# Patient Record
Sex: Female | Born: 1965 | Race: White | Hispanic: No | Smoking: Never smoker
Health system: Southern US, Community
[De-identification: ages and names within clinical notes are randomized; demographics above are authoritative.]

## PROBLEM LIST (undated history)

## (undated) DIAGNOSIS — E119 Type 2 diabetes mellitus without complications: Secondary | ICD-10-CM

## (undated) DIAGNOSIS — E7800 Pure hypercholesterolemia, unspecified: Secondary | ICD-10-CM

## (undated) DIAGNOSIS — Z8774 Personal history of (corrected) congenital malformations of heart and circulatory system: Secondary | ICD-10-CM

## (undated) DIAGNOSIS — I10 Essential (primary) hypertension: Secondary | ICD-10-CM

## (undated) DIAGNOSIS — I519 Heart disease, unspecified: Secondary | ICD-10-CM

## (undated) HISTORY — DX: Essential (primary) hypertension: I10

## (undated) HISTORY — DX: Personal history of (corrected) congenital malformations of heart and circulatory system: Z87.74

## (undated) HISTORY — DX: Pure hypercholesterolemia, unspecified: E78.00

## (undated) HISTORY — DX: Type 2 diabetes mellitus without complications: E11.9

## (undated) HISTORY — DX: Heart disease, unspecified: I51.9

---

## 2006-02-19 ENCOUNTER — Encounter: Admission: RE | Admit: 2006-02-19 | Discharge: 2006-02-19 | Payer: Self-pay | Admitting: Family Medicine

## 2006-03-05 ENCOUNTER — Encounter: Admission: RE | Admit: 2006-03-05 | Discharge: 2006-03-05 | Payer: Self-pay | Admitting: Family Medicine

## 2007-03-29 ENCOUNTER — Encounter: Admission: RE | Admit: 2007-03-29 | Discharge: 2007-03-29 | Payer: Self-pay | Admitting: Family Medicine

## 2008-03-29 ENCOUNTER — Encounter: Admission: RE | Admit: 2008-03-29 | Discharge: 2008-03-29 | Payer: Self-pay | Admitting: Family Medicine

## 2008-05-06 IMAGING — MG MM DIGITAL SCREENING BILAT W/ CAD
4 series · 4 of 4 positions shown · non-contrast
Comparison: none

DG SCREEN MAMMOGRAM BILATERAL
Bilateral CC and MLO view(s) were taken.

DIGITAL SCREENING MAMMOGRAM WITH CAD:
There are scattered fibroglandular densities.  No masses or malignant type calcifications are 
identified.  Compared with prior studies.

[R CC]
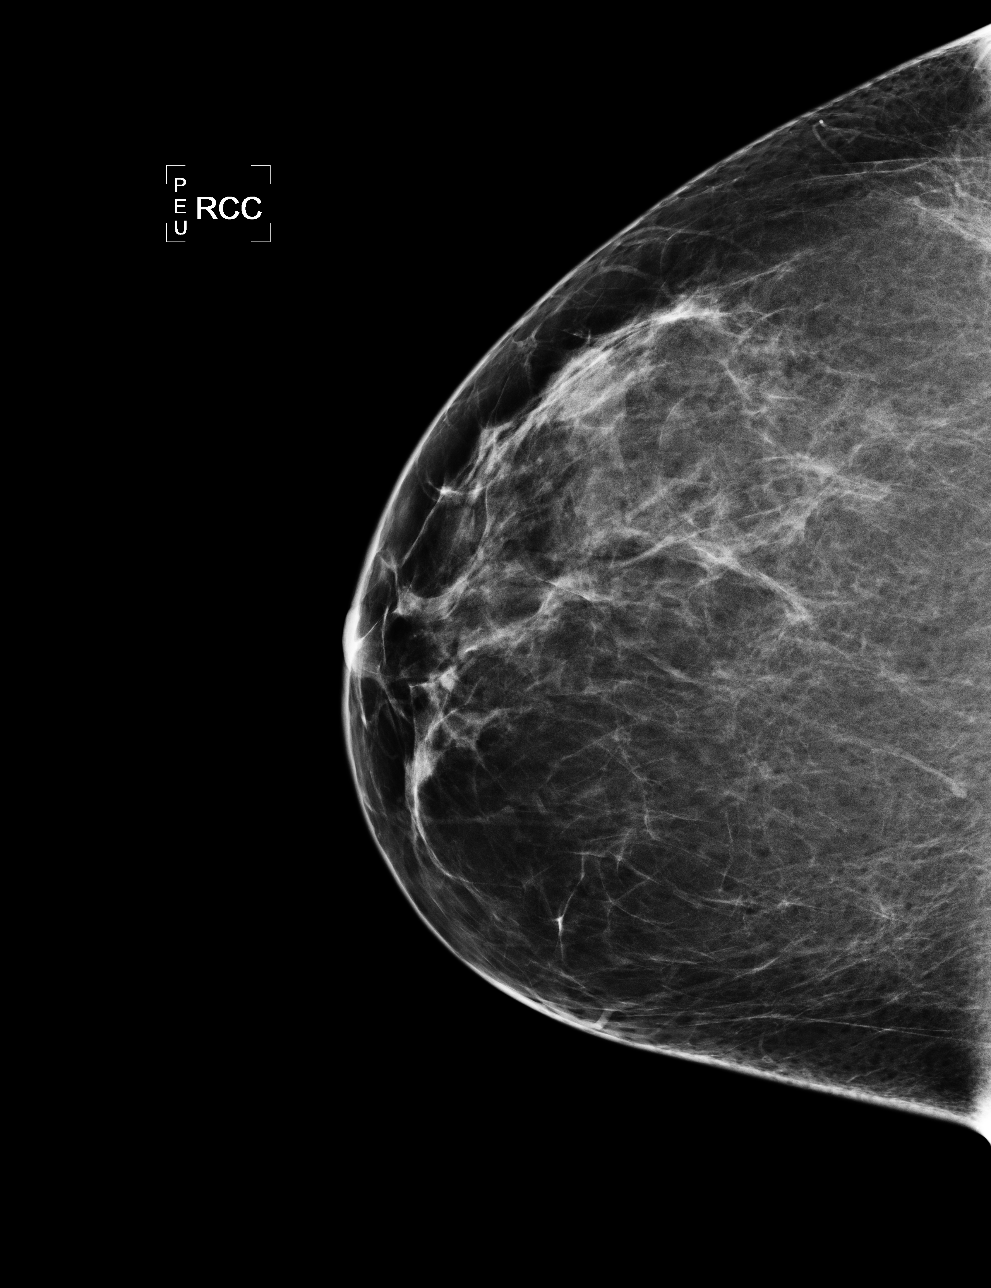

[L CC]
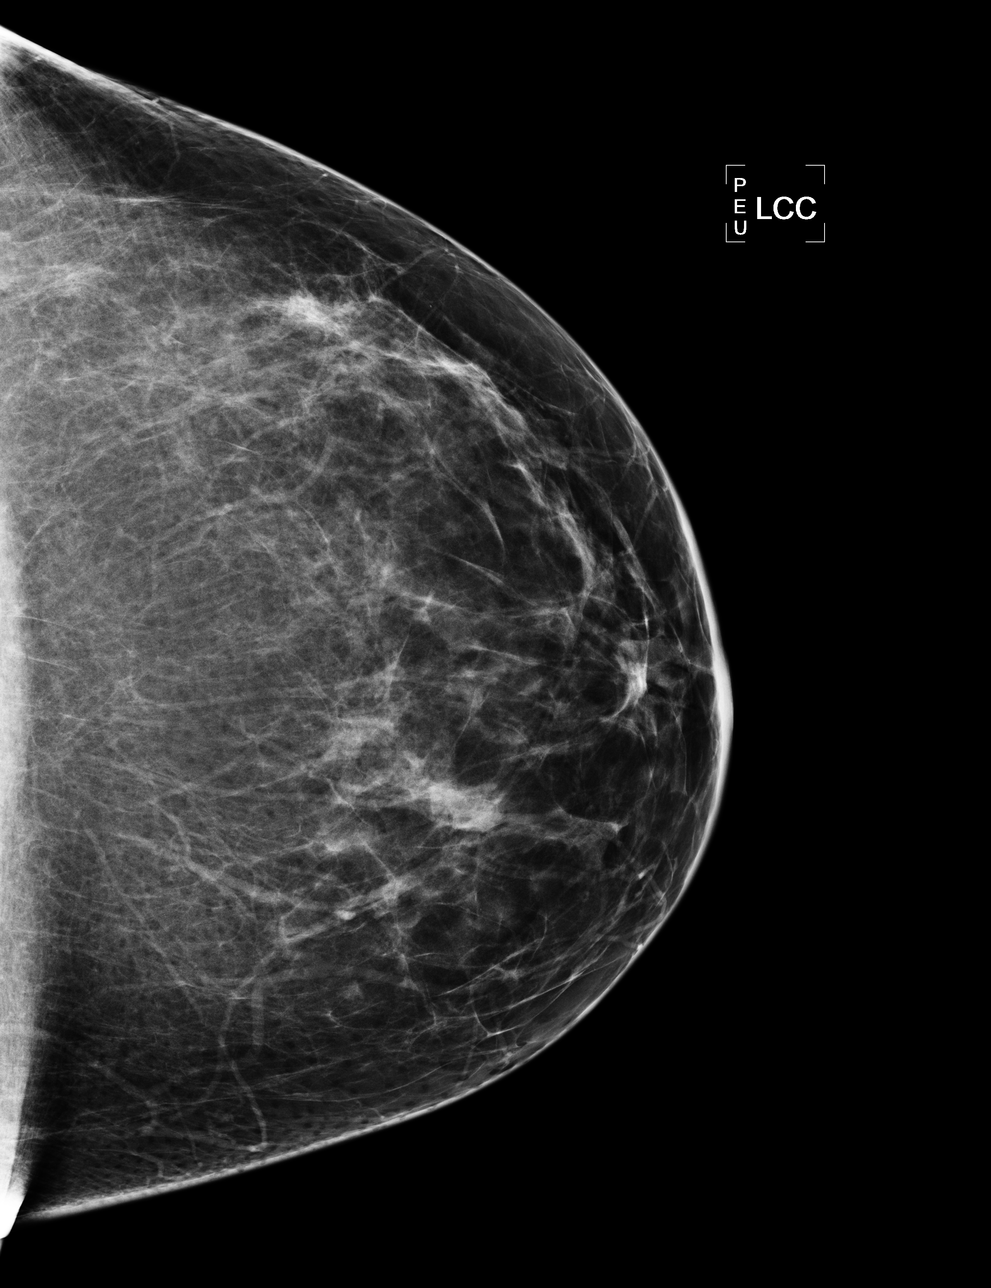

[L MLO]
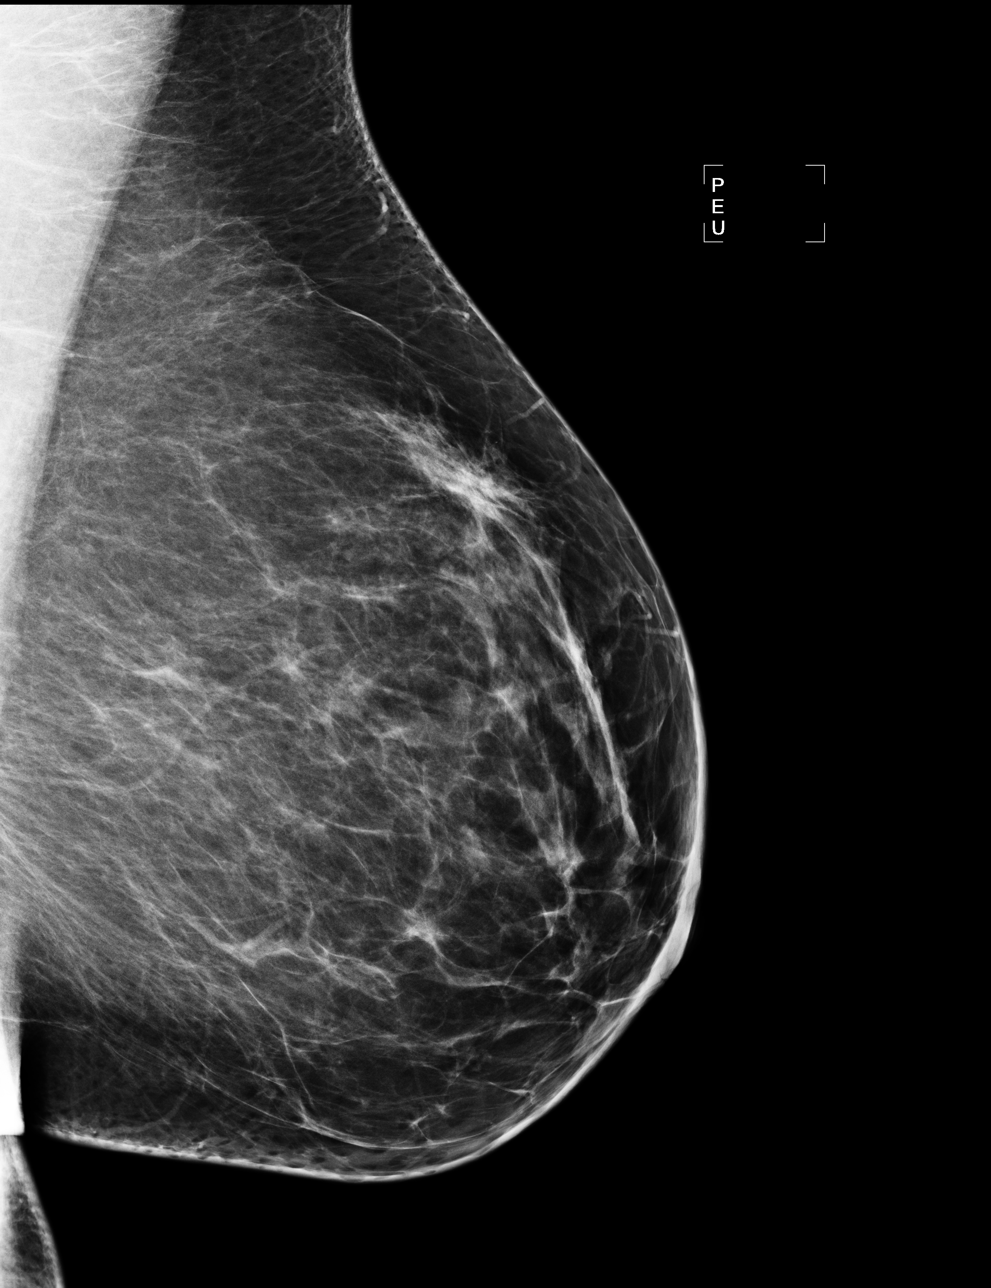

[R MLO]
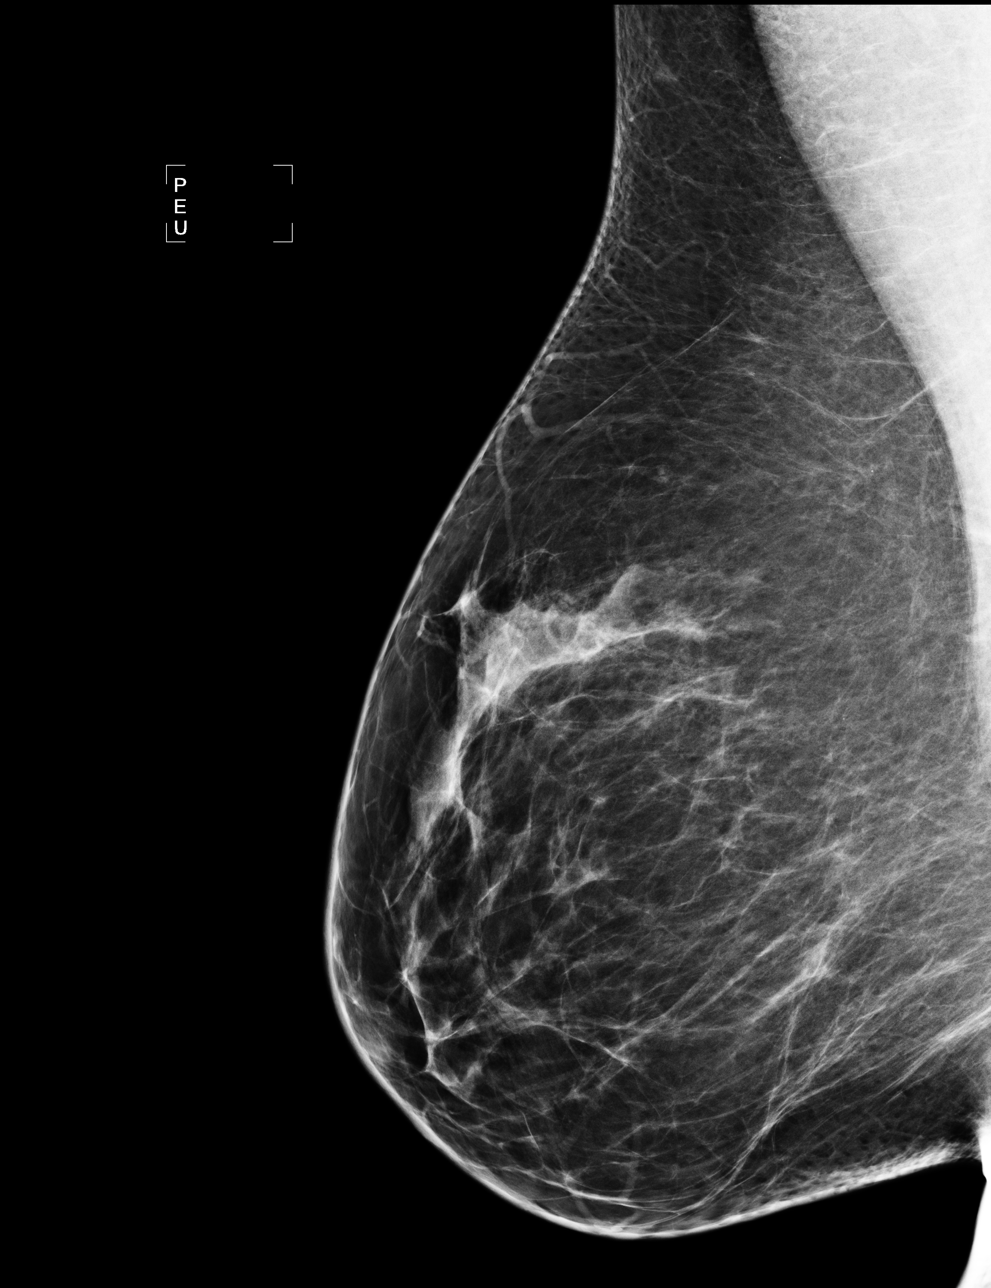

[4 of 4 positions shown; findings below may reference images not displayed]

IMPRESSION: No specific mammographic evidence of malignancy.  Next screening mammogram is recommended in one 
year.

ASSESSMENT: Negative - BI-RADS 1

Screening mammogram in 1 year.
ANALYZED BY COMPUTER AIDED DETECTION. , THIS PROCEDURE WAS A DIGITAL MAMMOGRAM.

## 2011-12-08 HISTORY — PX: VAGINAL HYSTERECTOMY: SUR661

## 2014-08-28 ENCOUNTER — Ambulatory Visit (INDEPENDENT_AMBULATORY_CARE_PROVIDER_SITE_OTHER): Payer: 59 | Admitting: Cardiovascular Disease

## 2014-08-28 ENCOUNTER — Encounter: Payer: Self-pay | Admitting: Cardiovascular Disease

## 2014-08-28 VITALS — BP 143/96 | HR 59 | Ht 63.0 in | Wt 173.5 lb

## 2014-08-28 DIAGNOSIS — E119 Type 2 diabetes mellitus without complications: Secondary | ICD-10-CM | POA: Insufficient documentation

## 2014-08-28 DIAGNOSIS — E785 Hyperlipidemia, unspecified: Secondary | ICD-10-CM

## 2014-08-28 DIAGNOSIS — Q2381 Bicuspid aortic valve: Secondary | ICD-10-CM | POA: Insufficient documentation

## 2014-08-28 DIAGNOSIS — I10 Essential (primary) hypertension: Secondary | ICD-10-CM

## 2014-08-28 DIAGNOSIS — Q231 Congenital insufficiency of aortic valve: Secondary | ICD-10-CM

## 2014-08-28 NOTE — Patient Instructions (Signed)
  We will see you back in follow up as needed.   Dr Allyson Sabal has ordered: 1.  Echocardiogram. Echocardiography is a painless test that uses sound waves to create images of your heart. It provides your doctor with information about the size and shape of your heart and how well your heart's chambers and valves are working. This procedure takes approximately one hour. There are no restrictions for this procedure.

## 2014-08-28 NOTE — Assessment & Plan Note (Signed)
On statin therapy followed by her PCP 

## 2014-08-28 NOTE — Progress Notes (Signed)
     08/28/2014 Pilar Grammes   16-Nov-1966  [id number]  Primary Physician Renae Fickle, MD Primary Cardiologist: Runell Gess MD Roseanne Reno   HPI:  [name] is a very pleasant 48 year old mildly overweight married Caucasian female mother of 2 children whose mother is a patient of mine. She was referred for cardiovascular evaluation because of a congenital bicuspid aortic valve. Her primary care physician is Dr. Loma Boston in [facility]. Her cardiac risk factor profile is notable for treated hypertension, hyperlipidemia, diabetes and family history. Her father died of a myocardial infarction at age 15. She's never had a heart attack or stroke, and denies chest pain or shortness of breath. She currently has a congenital bicuspid aortic valve which has been followed by duplex ultrasound in the past.   Current Outpatient Prescriptions  Medication Sig Dispense Refill  . aspirin EC 81 MG tablet Take 81 mg by mouth daily.      Marland Kitchen atorvastatin (LIPITOR) 40 MG tablet Take 40 mg by mouth daily. Take 1/2 tablet daily      . Cranberry-Vitamin C-Vitamin E (CRANBERRY PLUS VITAMIN C) 4200-20-3 MG-MG-UNIT CAPS Take 4,200 mg by mouth daily.      Marland Kitchen glipiZIDE-metformin (METAGLIP) 5-500 MG per tablet Take 1 tablet by mouth 2 (two) times daily before a meal.      . lisinopril (PRINIVIL,ZESTRIL) 10 MG tablet Take 10 mg by mouth daily.       No current facility-administered medications for this visit.    Not on File  History   Social History  . Marital Status: Unknown    Spouse Name: N/A    Number of Children: N/A  . Years of Education: N/A   Occupational History  . Not on file.   Social History Main Topics  . Smoking status: Never Smoker   . Smokeless tobacco: Not on file  . Alcohol Use: No  . Drug Use: No  . Sexual Activity: Not on file   Other Topics Concern  . Not on file   Social History Narrative  . No narrative on file     Review of Systems: General:  negative for chills, fever, night sweats or weight changes.  Cardiovascular: negative for chest pain, dyspnea on exertion, edema, orthopnea, palpitations, paroxysmal nocturnal dyspnea or shortness of breath Dermatological: negative for rash Respiratory: negative for cough or wheezing Urologic: negative for hematuria Abdominal: negative for nausea, vomiting, diarrhea, bright red blood per rectum, melena, or hematemesis Neurologic: negative for visual changes, syncope, or dizziness All other systems reviewed and are otherwise negative except as noted above.    Blood pressure 143/96, pulse 59, height  (1.6 m), weight 173 lb 8 oz (78.699 kg).  General appearance: alert and no distress Neck: no adenopathy, no carotid bruit, no JVD, supple, symmetrical, trachea midline and thyroid not enlarged, symmetric, no tenderness/mass/nodules Lungs: clear to auscultation bilaterally Heart: regular rate and rhythm, S1, S2 normal, no murmur, click, rub or gallop Extremities: extremities normal, atraumatic, no cyanosis or edema and 2+ pedal pulses bilaterally  EKG sinus bradycardia of 59 without ST or T wave changes  ASSESSMENT AND PLAN:   Essential hypertension Mildly elevated today I medications, otherwise under good control  Hyperlipidemia On statin therapy followed by her PCP  Congenital bicuspid aortic valve I cannot hear a murmur today and she is otherwise asymptomatic. I am going to check a 2-D echocardiogram      Runell Gess MD [facility], [facility] 08/28/2014 11:09 AM

## 2014-08-28 NOTE — Assessment & Plan Note (Signed)
I cannot hear a murmur today and she is otherwise asymptomatic. I am going to check a 2-D echocardiogram

## 2014-08-28 NOTE — Assessment & Plan Note (Signed)
Mildly elevated today I medications, otherwise under good control

## 2014-09-05 ENCOUNTER — Ambulatory Visit (HOSPITAL_COMMUNITY)
Admission: RE | Admit: 2014-09-05 | Discharge: 2014-09-05 | Disposition: A | Discharge status: Home / Self Care | Payer: 59 | Source: Ambulatory Visit | Attending: Cardiovascular Disease | Admitting: Cardiovascular Disease

## 2014-09-05 DIAGNOSIS — Z8249 Family history of ischemic heart disease and other diseases of the circulatory system: Secondary | ICD-10-CM | POA: Insufficient documentation

## 2014-09-05 DIAGNOSIS — E785 Hyperlipidemia, unspecified: Secondary | ICD-10-CM | POA: Insufficient documentation

## 2014-09-05 DIAGNOSIS — I10 Essential (primary) hypertension: Secondary | ICD-10-CM | POA: Diagnosis not present

## 2014-09-05 DIAGNOSIS — Q231 Congenital insufficiency of aortic valve: Secondary | ICD-10-CM | POA: Diagnosis present

## 2014-09-05 DIAGNOSIS — E119 Type 2 diabetes mellitus without complications: Secondary | ICD-10-CM | POA: Insufficient documentation

## 2014-09-05 DIAGNOSIS — I359 Nonrheumatic aortic valve disorder, unspecified: Secondary | ICD-10-CM

## 2014-09-05 NOTE — Progress Notes (Signed)
2D Echocardiogram Complete.  09/05/2014   Ayelen Sciortino, RDCS  

## 2014-09-10 ENCOUNTER — Encounter: Payer: Self-pay | Admitting: *Deleted

## 2014-09-12 ENCOUNTER — Telehealth: Payer: Self-pay | Admitting: Cardiovascular Disease

## 2014-09-12 NOTE — Telephone Encounter (Signed)
Mrs.Deaton is calling to find out the results of her echo .Marland Kitchen.Marland Kitchen.Please call   Thanks

## 2014-09-12 NOTE — Telephone Encounter (Signed)
Echo results given to mother and patient and notified a report was mailed out to her.  Voiced understanding.

## 2018-01-17 DIAGNOSIS — E119 Type 2 diabetes mellitus without complications: Secondary | ICD-10-CM | POA: Diagnosis not present

## 2018-01-17 DIAGNOSIS — Z0000 Encounter for general adult medical examination without abnormal findings: Secondary | ICD-10-CM | POA: Diagnosis not present

## 2018-01-17 DIAGNOSIS — I10 Essential (primary) hypertension: Secondary | ICD-10-CM | POA: Diagnosis not present

## 2018-01-17 DIAGNOSIS — Z23 Encounter for immunization: Secondary | ICD-10-CM | POA: Diagnosis not present

## 2018-01-17 DIAGNOSIS — E785 Hyperlipidemia, unspecified: Secondary | ICD-10-CM | POA: Diagnosis not present

## 2018-01-28 DIAGNOSIS — Z1231 Encounter for screening mammogram for malignant neoplasm of breast: Secondary | ICD-10-CM | POA: Diagnosis not present

## 2018-01-28 DIAGNOSIS — R319 Hematuria, unspecified: Secondary | ICD-10-CM | POA: Diagnosis not present

## 2018-07-19 DIAGNOSIS — E538 Deficiency of other specified B group vitamins: Secondary | ICD-10-CM | POA: Diagnosis not present

## 2018-07-19 DIAGNOSIS — I10 Essential (primary) hypertension: Secondary | ICD-10-CM | POA: Diagnosis not present

## 2018-07-19 DIAGNOSIS — Z23 Encounter for immunization: Secondary | ICD-10-CM | POA: Diagnosis not present

## 2018-07-19 DIAGNOSIS — E785 Hyperlipidemia, unspecified: Secondary | ICD-10-CM | POA: Diagnosis not present

## 2018-07-19 DIAGNOSIS — E118 Type 2 diabetes mellitus with unspecified complications: Secondary | ICD-10-CM | POA: Diagnosis not present

## 2019-01-23 DIAGNOSIS — Z6827 Body mass index (BMI) 27.0-27.9, adult: Secondary | ICD-10-CM | POA: Diagnosis not present

## 2019-01-23 DIAGNOSIS — Z0000 Encounter for general adult medical examination without abnormal findings: Secondary | ICD-10-CM | POA: Diagnosis not present

## 2019-01-23 DIAGNOSIS — E785 Hyperlipidemia, unspecified: Secondary | ICD-10-CM | POA: Diagnosis not present

## 2019-01-23 DIAGNOSIS — Z1231 Encounter for screening mammogram for malignant neoplasm of breast: Secondary | ICD-10-CM | POA: Diagnosis not present

## 2019-02-10 DIAGNOSIS — Z1231 Encounter for screening mammogram for malignant neoplasm of breast: Secondary | ICD-10-CM | POA: Diagnosis not present

## 2019-07-24 DIAGNOSIS — E118 Type 2 diabetes mellitus with unspecified complications: Secondary | ICD-10-CM | POA: Diagnosis not present

## 2019-07-24 DIAGNOSIS — E785 Hyperlipidemia, unspecified: Secondary | ICD-10-CM | POA: Diagnosis not present

## 2019-07-24 DIAGNOSIS — E538 Deficiency of other specified B group vitamins: Secondary | ICD-10-CM | POA: Diagnosis not present

## 2019-07-24 DIAGNOSIS — E559 Vitamin D deficiency, unspecified: Secondary | ICD-10-CM | POA: Diagnosis not present

## 2019-07-24 DIAGNOSIS — E663 Overweight: Secondary | ICD-10-CM | POA: Diagnosis not present

## 2019-07-24 DIAGNOSIS — Z1331 Encounter for screening for depression: Secondary | ICD-10-CM | POA: Diagnosis not present

## 2019-11-20 DIAGNOSIS — J3489 Other specified disorders of nose and nasal sinuses: Secondary | ICD-10-CM | POA: Diagnosis not present

## 2019-11-20 DIAGNOSIS — Z20828 Contact with and (suspected) exposure to other viral communicable diseases: Secondary | ICD-10-CM | POA: Diagnosis not present

## 2020-02-06 DIAGNOSIS — E118 Type 2 diabetes mellitus with unspecified complications: Secondary | ICD-10-CM | POA: Diagnosis not present

## 2020-02-06 DIAGNOSIS — E538 Deficiency of other specified B group vitamins: Secondary | ICD-10-CM | POA: Diagnosis not present

## 2020-02-06 DIAGNOSIS — E559 Vitamin D deficiency, unspecified: Secondary | ICD-10-CM | POA: Diagnosis not present

## 2020-02-06 DIAGNOSIS — E785 Hyperlipidemia, unspecified: Secondary | ICD-10-CM | POA: Diagnosis not present

## 2020-02-06 DIAGNOSIS — Z1231 Encounter for screening mammogram for malignant neoplasm of breast: Secondary | ICD-10-CM | POA: Diagnosis not present

## 2020-02-12 DIAGNOSIS — Z1231 Encounter for screening mammogram for malignant neoplasm of breast: Secondary | ICD-10-CM | POA: Diagnosis not present

## 2020-08-08 DIAGNOSIS — E785 Hyperlipidemia, unspecified: Secondary | ICD-10-CM | POA: Diagnosis not present

## 2020-08-08 DIAGNOSIS — E118 Type 2 diabetes mellitus with unspecified complications: Secondary | ICD-10-CM | POA: Diagnosis not present

## 2020-08-08 DIAGNOSIS — Z1331 Encounter for screening for depression: Secondary | ICD-10-CM | POA: Diagnosis not present

## 2020-08-08 DIAGNOSIS — I10 Essential (primary) hypertension: Secondary | ICD-10-CM | POA: Diagnosis not present

## 2020-08-08 DIAGNOSIS — Q231 Congenital insufficiency of aortic valve: Secondary | ICD-10-CM | POA: Diagnosis not present

## 2020-08-08 DIAGNOSIS — E039 Hypothyroidism, unspecified: Secondary | ICD-10-CM | POA: Diagnosis not present

## 2020-08-08 DIAGNOSIS — Z6827 Body mass index (BMI) 27.0-27.9, adult: Secondary | ICD-10-CM | POA: Diagnosis not present

## 2020-08-08 DIAGNOSIS — E559 Vitamin D deficiency, unspecified: Secondary | ICD-10-CM | POA: Diagnosis not present

## 2021-02-11 DIAGNOSIS — E118 Type 2 diabetes mellitus with unspecified complications: Secondary | ICD-10-CM | POA: Diagnosis not present

## 2021-02-11 DIAGNOSIS — Q231 Congenital insufficiency of aortic valve: Secondary | ICD-10-CM | POA: Diagnosis not present

## 2021-02-11 DIAGNOSIS — Z6826 Body mass index (BMI) 26.0-26.9, adult: Secondary | ICD-10-CM | POA: Diagnosis not present

## 2021-02-11 DIAGNOSIS — E785 Hyperlipidemia, unspecified: Secondary | ICD-10-CM | POA: Diagnosis not present

## 2021-02-11 DIAGNOSIS — Z1231 Encounter for screening mammogram for malignant neoplasm of breast: Secondary | ICD-10-CM | POA: Diagnosis not present

## 2021-02-11 DIAGNOSIS — E559 Vitamin D deficiency, unspecified: Secondary | ICD-10-CM | POA: Diagnosis not present

## 2021-02-11 DIAGNOSIS — E039 Hypothyroidism, unspecified: Secondary | ICD-10-CM | POA: Diagnosis not present

## 2021-03-11 DIAGNOSIS — Z1231 Encounter for screening mammogram for malignant neoplasm of breast: Secondary | ICD-10-CM | POA: Diagnosis not present

## 2021-08-25 DIAGNOSIS — E559 Vitamin D deficiency, unspecified: Secondary | ICD-10-CM | POA: Diagnosis not present

## 2021-08-25 DIAGNOSIS — E538 Deficiency of other specified B group vitamins: Secondary | ICD-10-CM | POA: Diagnosis not present

## 2021-08-25 DIAGNOSIS — E118 Type 2 diabetes mellitus with unspecified complications: Secondary | ICD-10-CM | POA: Diagnosis not present

## 2021-08-25 DIAGNOSIS — E039 Hypothyroidism, unspecified: Secondary | ICD-10-CM | POA: Diagnosis not present

## 2021-08-25 DIAGNOSIS — I10 Essential (primary) hypertension: Secondary | ICD-10-CM | POA: Diagnosis not present

## 2021-08-25 DIAGNOSIS — E785 Hyperlipidemia, unspecified: Secondary | ICD-10-CM | POA: Diagnosis not present

## 2022-02-24 DIAGNOSIS — I10 Essential (primary) hypertension: Secondary | ICD-10-CM | POA: Diagnosis not present

## 2022-02-24 DIAGNOSIS — E1169 Type 2 diabetes mellitus with other specified complication: Secondary | ICD-10-CM | POA: Diagnosis not present

## 2022-02-24 DIAGNOSIS — E538 Deficiency of other specified B group vitamins: Secondary | ICD-10-CM | POA: Diagnosis not present

## 2022-02-24 DIAGNOSIS — E039 Hypothyroidism, unspecified: Secondary | ICD-10-CM | POA: Diagnosis not present

## 2022-02-24 DIAGNOSIS — E785 Hyperlipidemia, unspecified: Secondary | ICD-10-CM | POA: Diagnosis not present

## 2022-02-24 DIAGNOSIS — E559 Vitamin D deficiency, unspecified: Secondary | ICD-10-CM | POA: Diagnosis not present

## 2022-04-03 DIAGNOSIS — Z1231 Encounter for screening mammogram for malignant neoplasm of breast: Secondary | ICD-10-CM | POA: Diagnosis not present

## 2022-11-11 DIAGNOSIS — H6121 Impacted cerumen, right ear: Secondary | ICD-10-CM | POA: Diagnosis not present

## 2022-11-11 DIAGNOSIS — E039 Hypothyroidism, unspecified: Secondary | ICD-10-CM | POA: Diagnosis not present

## 2022-11-11 DIAGNOSIS — E538 Deficiency of other specified B group vitamins: Secondary | ICD-10-CM | POA: Diagnosis not present

## 2022-11-11 DIAGNOSIS — E785 Hyperlipidemia, unspecified: Secondary | ICD-10-CM | POA: Diagnosis not present

## 2022-11-11 DIAGNOSIS — I10 Essential (primary) hypertension: Secondary | ICD-10-CM | POA: Diagnosis not present

## 2022-11-11 DIAGNOSIS — E559 Vitamin D deficiency, unspecified: Secondary | ICD-10-CM | POA: Diagnosis not present

## 2022-11-11 DIAGNOSIS — E1169 Type 2 diabetes mellitus with other specified complication: Secondary | ICD-10-CM | POA: Diagnosis not present

## 2023-03-25 DIAGNOSIS — R0602 Shortness of breath: Secondary | ICD-10-CM | POA: Diagnosis not present

## 2023-03-25 DIAGNOSIS — R9431 Abnormal electrocardiogram [ECG] [EKG]: Secondary | ICD-10-CM | POA: Diagnosis not present

## 2023-03-25 DIAGNOSIS — R079 Chest pain, unspecified: Secondary | ICD-10-CM | POA: Diagnosis not present

## 2023-03-25 DIAGNOSIS — I10 Essential (primary) hypertension: Secondary | ICD-10-CM | POA: Diagnosis not present

## 2023-03-25 DIAGNOSIS — E119 Type 2 diabetes mellitus without complications: Secondary | ICD-10-CM | POA: Diagnosis not present

## 2023-04-15 DIAGNOSIS — Z1231 Encounter for screening mammogram for malignant neoplasm of breast: Secondary | ICD-10-CM | POA: Diagnosis not present

## 2023-05-13 DIAGNOSIS — E785 Hyperlipidemia, unspecified: Secondary | ICD-10-CM | POA: Diagnosis not present

## 2023-05-13 DIAGNOSIS — I10 Essential (primary) hypertension: Secondary | ICD-10-CM | POA: Diagnosis not present

## 2023-05-13 DIAGNOSIS — E1169 Type 2 diabetes mellitus with other specified complication: Secondary | ICD-10-CM | POA: Diagnosis not present

## 2023-05-13 DIAGNOSIS — E559 Vitamin D deficiency, unspecified: Secondary | ICD-10-CM | POA: Diagnosis not present

## 2023-05-13 DIAGNOSIS — Z6826 Body mass index (BMI) 26.0-26.9, adult: Secondary | ICD-10-CM | POA: Diagnosis not present

## 2023-05-13 DIAGNOSIS — E039 Hypothyroidism, unspecified: Secondary | ICD-10-CM | POA: Diagnosis not present

## 2023-06-07 DIAGNOSIS — N289 Disorder of kidney and ureter, unspecified: Secondary | ICD-10-CM | POA: Diagnosis not present

## 2023-07-07 ENCOUNTER — Ambulatory Visit: Payer: BC Managed Care – PPO | Attending: Cardiovascular Disease | Admitting: Cardiovascular Disease

## 2023-07-07 ENCOUNTER — Encounter: Payer: Self-pay | Admitting: Cardiovascular Disease

## 2023-07-07 VITALS — BP 112/76 | HR 74 | Ht 63.0 in | Wt 163.6 lb

## 2023-07-07 DIAGNOSIS — Q231 Congenital insufficiency of aortic valve: Secondary | ICD-10-CM

## 2023-07-07 DIAGNOSIS — I10 Essential (primary) hypertension: Secondary | ICD-10-CM

## 2023-07-07 DIAGNOSIS — E782 Mixed hyperlipidemia: Secondary | ICD-10-CM

## 2023-07-07 NOTE — Patient Instructions (Signed)
Medication Instructions:  Your physician recommends that you continue on your current medications as directed. Please refer to the Current Medication list given to you today.  *If you need a refill on your cardiac medications before your next appointment, please call your pharmacy*   Testing/Procedures: Your physician has requested that you have an echocardiogram. Echocardiography is a painless test that uses sound waves to create images of your heart. It provides your doctor with information about the size and shape of your heart and how well your heart's chambers and valves are working. This procedure takes approximately one hour. There are no restrictions for this procedure. Please do NOT wear cologne, perfume, aftershave, or lotions (deodorant is allowed). Please arrive 15 minutes prior to your appointment time. This will take place at [address] [place]    Dr. Allyson Sabal has ordered a CT coronary calcium score.   Test locations:  [facility]  [place] [facility]  This is $99 out of pocket.   Coronary CalciumScan A coronary calcium scan is an imaging test used to look for deposits of calcium and other fatty materials (plaques) in the inner lining of the blood vessels of the heart (coronary arteries). These deposits of calcium and plaques can partly clog and narrow the coronary arteries without producing any symptoms or warning signs. This puts a person at risk for a heart attack. This test can detect these deposits before symptoms develop. Tell a health care provider about: Any allergies you have. All medicines you are taking, including vitamins, herbs, eye drops, creams, and over-the-counter medicines. Any problems you or family members have had with anesthetic medicines. Any blood disorders you have. Any surgeries you have had. Any medical conditions you have. Whether you are pregnant or may  be pregnant. What are the risks? Generally, this is a safe procedure. However, problems may occur, including: Harm to a pregnant woman and her unborn baby. This test involves the use of radiation. Radiation exposure can be dangerous to a pregnant woman and her unborn baby. If you are pregnant, you generally should not have this procedure done. Slight increase in the risk of cancer. This is because of the radiation involved in the test. What happens before the procedure? No preparation is needed for this procedure. What happens during the procedure? You will undress and remove any jewelry around your neck or chest. You will put on a hospital gown. Sticky electrodes will be placed on your chest. The electrodes will be connected to an electrocardiogram (ECG) machine to record a tracing of the electrical activity of your heart. A CT scanner will take pictures of your heart. During this time, you will be asked to lie still and hold your breath for 2-3 seconds while a picture of your heart is being taken. The procedure may vary among health care providers and hospitals. What happens after the procedure? You can get dressed. You can return to your normal activities. It is up to you to get the results of your test. Ask your health care provider, or the department that is doing the test, when your results will be ready. Summary A coronary calcium scan is an imaging test used to look for deposits of calcium and other fatty materials (plaques) in the inner lining of the blood vessels of the heart (coronary arteries). Generally, this is a safe procedure. Tell your health care provider if you are pregnant or may be pregnant.  No preparation is needed for this procedure. A CT scanner will take pictures of your heart. You can return to your normal activities after the scan is done. This information is not intended to replace advice given to you by your health care provider. Make sure you discuss any questions  you have with your health care provider. Document Released: 05/21/2008 Document Revised: 10/12/2016 Document Reviewed: 10/12/2016 Elsevier Interactive Patient Education  2017 [employer].    Follow-Up: At [facility], you and your health needs are our priority.  As part of our continuing mission to provide you with exceptional heart care, we have created designated Provider Care Teams.  These Care Teams include your primary Cardiologist (physician) and Advanced Practice Providers (APPs -  Physician Assistants and Nurse Practitioners) who all work together to provide you with the care you need, when you need it.  We recommend signing up for the patient portal called "MyChart".  Sign up information is provided on this After Visit Summary.  MyChart is used to connect with patients for Virtual Visits (Telemedicine).  Patients are able to view lab/test results, encounter notes, upcoming appointments, etc.  Non-urgent messages can be sent to your provider as well.   To learn more about what you can do with MyChart, go to [email or web address].    Your next appointment:   3 month(s)  Provider:   Nanetta Batty, MD

## 2023-07-07 NOTE — Assessment & Plan Note (Signed)
History of hyperlipidemia on statin therapy lipid profile performed 05/13/2023 revealing total cholesterol 139, LDL of 66 and HDL 32.

## 2023-07-07 NOTE — Progress Notes (Signed)
07/07/2023 Shermaine Heaps   08/25/1966  [id number]  Primary Physician Guadalupe Maple., MD Primary Cardiologist: Runell Gess MD Nicholes Calamity, [place]  HPI:  Kelly Riggs is a 57 y.o.  mildly overweight married Caucasian female mother of 2 children, grandmother of 3 grandchildren, whose mother is a patient of mine. She was referred for cardiovascular evaluation because of a congenital bicuspid aortic valve. Her primary care physician was Dr. Loma Boston in [facility]. Her cardiac risk factor profile is notable for treated hypertension, hyperlipidemia, diabetes and family history. Her father died of a myocardial infarction at age 7. She's never had a heart attack or stroke, and denies chest pain or shortness of breath. She currently has a congenital bicuspid aortic valve which has been followed by duplex ultrasound in the past.  Since I saw her in 2015 she has done well.  She had 1 episode of chest pain resulting in a hospitalization at [facility] 6 months ago without etiology.  Since that time she has been asymptomatic.  She is fairly busy.  She works doing English as a second language teacher.   Current Meds  Medication Sig   aspirin EC 81 MG tablet Take 81 mg by mouth daily.   atorvastatin (LIPITOR) 40 MG tablet Take 40 mg by mouth daily. Take 1/2 tablet daily   glipiZIDE (GLUCOTROL XL) 5 MG 24 hr tablet Take 5 mg by mouth daily.   glipiZIDE-metformin (METAGLIP) 5-500 MG per tablet Take 1 tablet by mouth 2 (two) times daily before a meal.   JANUMET 50-1000 MG tablet Take 1 tablet by mouth daily.   levothyroxine (SYNTHROID) 25 MCG tablet Take 25 mcg by mouth every other day.   lisinopril (ZESTRIL) 40 MG tablet Take 10 mg by mouth daily.   TRULICITY 1.5 MG/0.5ML SOPN Inject 1.5 mg into the skin once a week.     No Known Allergies  Social History   Socioeconomic History   Marital status: Unknown    Spouse name: Not on file   Number of children: Not on file   Years of education:  Not on file   Highest education level: Not on file  Occupational History   Not on file  Tobacco Use   Smoking status: Never   Smokeless tobacco: Not on file  Substance and Sexual Activity   Alcohol use: No   Drug use: No   Sexual activity: Not on file  Other Topics Concern   Not on file  Social History Narrative   Not on file   Social Determinants of Health   Financial Resource Strain: Not on file  Food Insecurity: Not on file  Transportation Needs: Not on file  Physical Activity: Not on file  Stress: Not on file  Social Connections: Not on file  Intimate Partner Violence: Not on file     Review of Systems: General: negative for chills, fever, night sweats or weight changes.  Cardiovascular: negative for chest pain, dyspnea on exertion, edema, orthopnea, palpitations, paroxysmal nocturnal dyspnea or shortness of breath Dermatological: negative for rash Respiratory: negative for cough or wheezing Urologic: negative for hematuria Abdominal: negative for nausea, vomiting, diarrhea, bright red blood per rectum, melena, or hematemesis Neurologic: negative for visual changes, syncope, or dizziness All other systems reviewed and are otherwise negative except as noted above.    Blood pressure 112/76, pulse 74, height 5\' 3"  (1.6 m), weight 163 lb 9.6 oz (74.2 kg), SpO2 96%.  General appearance: alert and no distress Neck: no adenopathy,  no carotid bruit, no JVD, supple, symmetrical, trachea midline, and thyroid not enlarged, symmetric, no tenderness/mass/nodules Lungs: clear to auscultation bilaterally Heart: 2/6 outflow tract murmur consistent with aortic stenosis. Extremities: extremities normal, atraumatic, no cyanosis or edema Pulses: 2+ and symmetric Skin: Skin color, texture, turgor normal. No rashes or lesions Neurologic: Grossly normal  EKG EKG Interpretation Date/Time:  Wednesday July 07 2023 12:57:37 EDT Ventricular Rate:  74 PR Interval:  196 QRS  Duration:  126 QT Interval:  398 QTC Calculation: 441 R Axis:   31  Text Interpretation: Normal sinus rhythm Right bundle branch block Minimal voltage criteria for LVH, may be normal variant ( R in aVL ) T wave abnormality, consider lateral ischemia No previous ECGs available Confirmed by Nanetta Batty [id number]) on 07/07/2023 1:04:09 PM    ASSESSMENT AND PLAN:   Essential hypertension History of essential hypertension blood pressure measured today 112/76.  She is on lisinopril.  Hyperlipidemia History of hyperlipidemia on statin therapy lipid profile performed 05/13/2023 revealing total cholesterol 139, LDL of 66 and HDL 32.  Congenital bicuspid aortic valve History of congenital bicuspid aortic valve with mild stenosis by 2D echo 09/05/2014.  There is no mention of a ascending thoracic aortic dilatation.  She did have some chest pain 6 months ago which she thought was related to anxiety.  She does have an outflow tract murmur.  I am going to check a 2D echocardiogram.     Runell Gess MD [facility], [facility] 07/07/2023 1:14 PM

## 2023-07-07 NOTE — Assessment & Plan Note (Signed)
History of congenital bicuspid aortic valve with mild stenosis by 2D echo 09/05/2014.  There is no mention of a ascending thoracic aortic dilatation.  She did have some chest pain 6 months ago which she thought was related to anxiety.  She does have an outflow tract murmur.  I am going to check a 2D echocardiogram.

## 2023-07-07 NOTE — Assessment & Plan Note (Signed)
History of essential hypertension blood pressure measured today 112/76.  She is on lisinopril.

## 2023-08-04 ENCOUNTER — Ambulatory Visit: Payer: BC Managed Care – PPO | Attending: Cardiovascular Disease

## 2023-08-04 DIAGNOSIS — E782 Mixed hyperlipidemia: Secondary | ICD-10-CM

## 2023-08-04 DIAGNOSIS — I10 Essential (primary) hypertension: Secondary | ICD-10-CM

## 2023-08-04 DIAGNOSIS — Q2381 Bicuspid aortic valve: Secondary | ICD-10-CM

## 2023-08-04 DIAGNOSIS — Q231 Congenital insufficiency of aortic valve: Secondary | ICD-10-CM

## 2023-08-04 LAB — ECHOCARDIOGRAM COMPLETE
AR max vel: 1.54 cm2
AV Area VTI: 1.9 cm2
AV Area mean vel: 2 cm2
AV Mean grad: 17 mmHg
AV Peak grad: 32.5 mmHg
Ao pk vel: 2.85 m/s
Area-P 1/2: 3.19 cm2
MV M vel: 5.47 m/s
MV Peak grad: 119.7 mmHg
S' Lateral: 2.4 cm

## 2023-08-23 ENCOUNTER — Ambulatory Visit (HOSPITAL_BASED_OUTPATIENT_CLINIC_OR_DEPARTMENT_OTHER)
Admission: RE | Admit: 2023-08-23 | Discharge: 2023-08-23 | Disposition: A | Discharge status: Home / Self Care | Payer: BC Managed Care – PPO | Source: Ambulatory Visit | Attending: Cardiovascular Disease | Admitting: Cardiovascular Disease

## 2023-08-23 DIAGNOSIS — Q231 Congenital insufficiency of aortic valve: Secondary | ICD-10-CM | POA: Insufficient documentation

## 2023-08-23 DIAGNOSIS — I10 Essential (primary) hypertension: Secondary | ICD-10-CM | POA: Insufficient documentation

## 2023-08-23 DIAGNOSIS — E782 Mixed hyperlipidemia: Secondary | ICD-10-CM | POA: Insufficient documentation

## 2023-10-13 ENCOUNTER — Encounter: Payer: Self-pay | Admitting: Cardiovascular Disease

## 2023-10-13 ENCOUNTER — Ambulatory Visit: Payer: BC Managed Care – PPO | Attending: Cardiovascular Disease | Admitting: Cardiovascular Disease

## 2023-10-13 VITALS — BP 128/80 | HR 77 | Ht 63.0 in | Wt 164.0 lb

## 2023-10-13 DIAGNOSIS — Q2381 Bicuspid aortic valve: Secondary | ICD-10-CM | POA: Diagnosis not present

## 2023-10-13 NOTE — Progress Notes (Signed)
Kelly Riggs returns today for follow-up of her noninvasive diagnostic test done because of bicuspid aortic stenosis.  She is accompanied by her husband Kelly Riggs today.  Echo performed 08/04/2023 revealed mild aortic stenosis with a valve area of 1.9 cm and an ascending aorta that measured 45 mm.  She did have grade 1 diastolic dysfunction with normal LV function.  A coronary calcium score performed 08/23/2023 was 0.  Her ascending thoracic aorta again measured 45 mm.  She denies chest pain or shortness of breath.  Her lipid profile performed 05/13/2023 revealed total cholesterol 139, LDL of 66 and HDL of 32.  At this point, we will continue to follow her 2D echo on an annual basis and I will see her back in 12 months.  Runell Gess, M.D., FACP, [facility], [facility] [address] [place], [place]  [address]  [phone] 10/13/2023 3:47 PM

## 2023-10-13 NOTE — Patient Instructions (Addendum)
Medication Instructions:   No changes *If you need a refill on your cardiac medications before your next appointment, please call your pharmacy*   Lab Work: Not needed    Testing/Procedures: Will be schedule in After Aug 04, 2024 -Nov 2025  Your physician has requested that you have an echocardiogram. Echocardiography is a painless test that uses sound waves to create images of your heart. It provides your doctor with information about the size and shape of your heart and how well your heart's chambers and valves are working. This procedure takes approximately one hour. There are no restrictions for this procedure. Please do NOT wear cologne, perfume, aftershave, or lotions (deodorant is allowed). Please arrive 15 minutes prior to your appointment time.  Please note: We ask at that you not bring children with you during ultrasound (echo/ vascular) testing. Due to room size and safety concerns, children are not allowed in the ultrasound rooms during exams. Our front office staff cannot provide observation of children in our lobby area while testing is being conducted. An adult accompanying a patient to their appointment will only be allowed in the ultrasound room at the discretion of the ultrasound technician under special circumstances. We apologize for any inconvenience.    Follow-Up: At [facility], you and your health needs are our priority.  As part of our continuing mission to provide you with exceptional heart care, we have created designated Provider Care Teams.  These Care Teams include your primary Cardiologist (physician) and Advanced Practice Providers (APPs -  Physician Assistants and Nurse Practitioners) who all work together to provide you with the care you need, when you need it.     Your next appointment:   12 month(s)  The format for your next appointment:   In Person  Provider:   Nanetta Batty, MD

## 2023-11-15 DIAGNOSIS — I10 Essential (primary) hypertension: Secondary | ICD-10-CM | POA: Diagnosis not present

## 2023-11-15 DIAGNOSIS — E538 Deficiency of other specified B group vitamins: Secondary | ICD-10-CM | POA: Diagnosis not present

## 2023-11-15 DIAGNOSIS — E559 Vitamin D deficiency, unspecified: Secondary | ICD-10-CM | POA: Diagnosis not present

## 2023-11-15 DIAGNOSIS — E1169 Type 2 diabetes mellitus with other specified complication: Secondary | ICD-10-CM | POA: Diagnosis not present

## 2023-11-15 DIAGNOSIS — E039 Hypothyroidism, unspecified: Secondary | ICD-10-CM | POA: Diagnosis not present

## 2023-11-15 DIAGNOSIS — E785 Hyperlipidemia, unspecified: Secondary | ICD-10-CM | POA: Diagnosis not present

## 2024-03-06 DIAGNOSIS — R109 Unspecified abdominal pain: Secondary | ICD-10-CM | POA: Diagnosis not present

## 2024-05-15 DIAGNOSIS — E538 Deficiency of other specified B group vitamins: Secondary | ICD-10-CM | POA: Diagnosis not present

## 2024-05-15 DIAGNOSIS — I10 Essential (primary) hypertension: Secondary | ICD-10-CM | POA: Diagnosis not present

## 2024-05-15 DIAGNOSIS — E785 Hyperlipidemia, unspecified: Secondary | ICD-10-CM | POA: Diagnosis not present

## 2024-05-15 DIAGNOSIS — E1169 Type 2 diabetes mellitus with other specified complication: Secondary | ICD-10-CM | POA: Diagnosis not present

## 2024-05-15 DIAGNOSIS — E039 Hypothyroidism, unspecified: Secondary | ICD-10-CM | POA: Diagnosis not present

## 2024-05-15 DIAGNOSIS — E559 Vitamin D deficiency, unspecified: Secondary | ICD-10-CM | POA: Diagnosis not present

## 2024-05-18 ENCOUNTER — Telehealth: Payer: Self-pay | Admitting: Cardiovascular Disease

## 2024-05-18 NOTE — Telephone Encounter (Signed)
 Called pt in regards to letter request. Pt would like Dr. Katheryne Pane to write a letter stating her Cardiac Dx as well as explaining what it is in terms her children will understand.  Would also like letter to include when pt was dx.  Daughter is starting to display symptoms similar to hers.  Pt reports Dad and some of his sisters died in early 73's and pt is approaching 54's.  Pt would like a call to say letter is complete and is being mailed to her.

## 2024-05-18 NOTE — Telephone Encounter (Signed)
 Pt called in asking if she can get a letter about her heart condition sent out to her and when did she start seeing Dr. Katheryne Pane. She states she has a genetic condition and her daughter would like to know about it but she is unable to fully explain what it is. Please advise.

## 2024-05-25 NOTE — Telephone Encounter (Signed)
 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  This may be inherited.  She has had this since I am started seeing her in 2015.   Letter created and mailed to pt.

## 2024-06-01 DIAGNOSIS — N289 Disorder of kidney and ureter, unspecified: Secondary | ICD-10-CM | POA: Diagnosis not present

## 2024-08-04 ENCOUNTER — Other Ambulatory Visit: Payer: BC Managed Care – PPO

## 2024-08-08 ENCOUNTER — Ambulatory Visit: Payer: Self-pay | Admitting: Cardiovascular Disease

## 2024-08-08 ENCOUNTER — Ambulatory Visit (HOSPITAL_COMMUNITY)
Admission: RE | Admit: 2024-08-08 | Discharge: 2024-08-08 | Disposition: A | Discharge status: Home / Self Care | Source: Ambulatory Visit | Attending: Cardiology | Admitting: Cardiology

## 2024-08-08 DIAGNOSIS — Q2381 Bicuspid aortic valve: Secondary | ICD-10-CM | POA: Diagnosis not present

## 2024-08-08 DIAGNOSIS — I351 Nonrheumatic aortic (valve) insufficiency: Secondary | ICD-10-CM

## 2024-08-08 DIAGNOSIS — I712 Thoracic aortic aneurysm, without rupture, unspecified: Secondary | ICD-10-CM

## 2024-08-08 LAB — ECHOCARDIOGRAM COMPLETE
AR max vel: 1.53 cm2
AV Area VTI: 1.44 cm2
AV Area mean vel: 1.63 cm2
AV Mean grad: 16 mmHg
AV Peak grad: 30.5 mmHg
Ao pk vel: 2.76 m/s
MV VTI: 2.4 cm2
S' Lateral: 2.57 cm

## 2024-08-23 ENCOUNTER — Ambulatory Visit: Attending: Cardiovascular Disease | Admitting: Cardiovascular Disease

## 2024-08-23 ENCOUNTER — Encounter: Payer: Self-pay | Admitting: Cardiovascular Disease

## 2024-08-23 VITALS — BP 130/80 | HR 75 | Ht 63.0 in | Wt 166.0 lb

## 2024-08-23 DIAGNOSIS — E782 Mixed hyperlipidemia: Secondary | ICD-10-CM

## 2024-08-23 DIAGNOSIS — I10 Essential (primary) hypertension: Secondary | ICD-10-CM | POA: Diagnosis not present

## 2024-08-23 DIAGNOSIS — Q2381 Bicuspid aortic valve: Secondary | ICD-10-CM

## 2024-08-23 NOTE — Patient Instructions (Signed)
 Medication Instructions:  Your physician recommends that you continue on your current medications as directed. Please refer to the Current Medication list given to you today.  *If you need a refill on your cardiac medications before your next appointment, please call your pharmacy*  Testing/Procedures: Your physician has requested that you have an echocardiogram. Echocardiography is a painless test that uses sound waves to create images of your heart. It provides your doctor with information about the size and shape of your heart and how well your heart's chambers and valves are working. This procedure takes approximately one hour. There are no restrictions for this procedure. Please do NOT wear cologne, perfume, aftershave, or lotions (deodorant is allowed). Please arrive 15 minutes prior to your appointment time.  Please note: We ask at that you not bring children with you during ultrasound (echo/ vascular) testing. Due to room size and safety concerns, children are not allowed in the ultrasound rooms during exams. Our front office staff cannot provide observation of children in our lobby area while testing is being conducted. An adult accompanying a patient to their appointment will only be allowed in the ultrasound room at the discretion of the ultrasound technician under special circumstances. We apologize for any inconvenience. **To be done in September 2026**   Follow-Up: At [facility], you and your health needs are our priority.  As part of our continuing mission to provide you with exceptional heart care, our providers are all part of one team.  This team includes your primary Cardiologist (physician) and Advanced Practice Providers or APPs (Physician Assistants and Nurse Practitioners) who all work together to provide you with the care you need, when you need it.  Your next appointment:   12 month(s)  Provider:   Dorn Lesches, MD    We recommend signing up for the patient  portal called MyChart.  Sign up information is provided on this After Visit Summary.  MyChart is used to connect with patients for Virtual Visits (Telemedicine).  Patients are able to view lab/test results, encounter notes, upcoming appointments, etc.  Non-urgent messages can be sent to your provider as well.   To learn more about what you can do with MyChart, go to [email or web address].

## 2024-08-23 NOTE — Assessment & Plan Note (Signed)
 History of hyperlipidemia on atorvastatin with lipid profile performed 05/15/2024 revealing total cholesterol 123, LDL 64 and HDL 29.

## 2024-08-23 NOTE — Progress Notes (Signed)
 08/23/2024 Kelly Riggs   1966-09-21  [id number]  Primary Physician Pandora, Therisa RAMAN, NP Primary Cardiologist: Dorn JINNY Lesches MD GENI CODY MADEIRA, FSCAI  HPI:  Kelly Riggs is a 58 y.o.   mildly overweight married Caucasian female mother of 2 children, grandmother of 3 grandchildren, whose mother is a patient of mine, and who accompanies her today.Kelly Riggs She was referred for cardiovascular evaluation because of a congenital bicuspid aortic valve.  I last saw her in the office 10/13/2023.  Her primary care physician was Dr. Norleen Cage in [place] . Her cardiac risk factor profile is notable for treated hypertension, hyperlipidemia, diabetes and family history. Her father died of a myocardial infarction at age 20. She's never had a heart attack or stroke, and denies chest pain or shortness of breath. She currently has a congenital bicuspid aortic valve which has been followed by duplex ultrasound in the past.   Since I saw her in in the office 10 months ago she continues to do well.  She denies chest pain or shortness of breath.  Her 2D echo performed 08/08/2024 showed normal LV systolic function, mild to moderate LVH, grade 1 diastolic dysfunction and mild aortic stenosis with a valve area 1.44 cm with mild to moderate ascending thoracic aortic dilatation measured 44 mm unchanged from her last echo.  She did have a coronary calcium score performed 08/23/2023 which was 0.  She denies chest pain or shortness of breath.   Current Meds  Medication Sig   aspirin EC 81 MG tablet Take 81 mg by mouth daily.   atorvastatin (LIPITOR) 40 MG tablet Take 40 mg by mouth daily. Take 1/2 tablet daily   Cranberry-Vitamin C-Vitamin E (CRANBERRY PLUS VITAMIN C) 4200-20-3 MG-MG-UNIT CAPS Take 4,200 mg by mouth daily.   glipiZIDE (GLUCOTROL XL) 5 MG 24 hr tablet Take 5 mg by mouth daily.   glipiZIDE-metformin (METAGLIP) 5-500 MG per tablet Take 1 tablet by mouth 2 (two) times daily before a meal.    JANUMET 50-1000 MG tablet Take 1 tablet by mouth daily.   levothyroxine (SYNTHROID) 25 MCG tablet Take 25 mcg by mouth every other day.   lisinopril (ZESTRIL) 40 MG tablet Take 10 mg by mouth daily.   TRULICITY 1.5 MG/0.5ML SOPN Inject 1.5 mg into the skin once a week.     No Known Allergies  Social History   Socioeconomic History   Marital status: Unknown    Spouse name: Not on file   Number of children: Not on file   Years of education: Not on file   Highest education level: Not on file  Occupational History   Not on file  Tobacco Use   Smoking status: Never   Smokeless tobacco: Not on file  Substance and Sexual Activity   Alcohol use: No   Drug use: No   Sexual activity: Not on file  Other Topics Concern   Not on file  Social History Narrative   Not on file   Social Drivers of Health   Financial Resource Strain: Not on file  Food Insecurity: Not on file  Transportation Needs: Not on file  Physical Activity: Not on file  Stress: Not on file  Social Connections: Not on file  Intimate Partner Violence: Not on file     Review of Systems: General: negative for chills, fever, night sweats or weight changes.  Cardiovascular: negative for chest pain, dyspnea on exertion, edema, orthopnea, palpitations, paroxysmal nocturnal dyspnea or shortness of breath Dermatological:  negative for rash Respiratory: negative for cough or wheezing Urologic: negative for hematuria Abdominal: negative for nausea, vomiting, diarrhea, bright red blood per rectum, melena, or hematemesis Neurologic: negative for visual changes, syncope, or dizziness All other systems reviewed and are otherwise negative except as noted above.    Blood pressure 130/80, pulse 75, height 5' 3 (1.6 m), weight 166 lb (75.3 kg), SpO2 99%.  General appearance: alert and no distress Neck: no adenopathy, no carotid bruit, no JVD, supple, symmetrical, trachea midline, and thyroid  not enlarged, symmetric, no  tenderness/mass/nodules Lungs: clear to auscultation bilaterally Heart: 2/6 outflow tract murmur consistent with aortic stenosis. Extremities: extremities normal, atraumatic, no cyanosis or edema Pulses: 2+ and symmetric Skin: Skin color, texture, turgor normal. No rashes or lesions Neurologic: Grossly normal  EKG EKG Interpretation Date/Time:  Wednesday August 23 2024 15:14:21 EDT Ventricular Rate:  75 PR Interval:  180 QRS Duration:  80 QT Interval:  398 QTC Calculation: 444 R Axis:   -8  Text Interpretation: Sinus rhythm with Premature atrial complexes Left ventricular hypertrophy with repolarization abnormality ( R in aVL , Cornell product , Romhilt-Estes ) When compared with ECG of 13-Oct-2023 15:37, Premature atrial complexes are now Present Minimal criteria for Septal infarct are no longer Present Confirmed by Court Carrier [phone]) on 08/23/2024 3:39:57 PM    ASSESSMENT AND PLAN:   Essential hypertension History of essential hypertension blood pressure measured today 130/80.  She is on lisinopril.  Hyperlipidemia History of hyperlipidemia on atorvastatin with lipid profile performed 05/15/2024 revealing total cholesterol 123, LDL 64 and HDL 29.  Congenital bicuspid aortic valve History of congenital bicuspid aortic stenosis with echo performed 08/08/2024 revealing normal LV systolic function, moderate left ventricular perch reviewed grade 1 diastolic dysfunction and mild aortic stenosis with a valve area of 1.44 cm.  Her ascending aorta measured 44 mm unchanged from prior echoes.  This will be repeated on an annual basis.     Carrier DOROTHA Court MD FACP,FACC,FAHA, [facility] 08/23/2024 3:47 PM

## 2024-08-23 NOTE — Assessment & Plan Note (Signed)
 History of congenital bicuspid aortic stenosis with echo performed 08/08/2024 revealing normal LV systolic function, moderate left ventricular perch reviewed grade 1 diastolic dysfunction and mild aortic stenosis with a valve area of 1.44 cm.  Her ascending aorta measured 44 mm unchanged from prior echoes.  This will be repeated on an annual basis.

## 2024-08-23 NOTE — Assessment & Plan Note (Signed)
 History of essential hypertension blood pressure measured today 130/80.  She is on lisinopril.

## 2024-09-27 ENCOUNTER — Other Ambulatory Visit (HOSPITAL_COMMUNITY)

## 2024-10-20 NOTE — Progress Notes (Signed)
 Kelly Riggs                                          MRN: [id number]   10/20/2024   The [facility] Quality Team Specialist reviewed this patient medical record for the purposes of chart review for care gap closure. The following were reviewed: chart review for care gap closure-kidney health evaluation for diabetes:eGFR  and uACR.    [facility] Quality Team

## 2025-01-11 ENCOUNTER — Telehealth (HOSPITAL_COMMUNITY): Payer: Self-pay | Admitting: Cardiovascular Disease

## 2025-01-11 NOTE — Telephone Encounter (Signed)
 Patient called and cancelled the scheduled echocardiogram for August. Order will be removed from the echoWQ. If patient calls back we will reinstate the order. Thankyou.

## 2025-08-10 ENCOUNTER — Other Ambulatory Visit (HOSPITAL_COMMUNITY)
